# Patient Record
Sex: Male | Born: 1950 | Race: White | Hispanic: No | Marital: Married | Smoking: Never smoker
Health system: Southern US, Community
[De-identification: ages and names within clinical notes are randomized; demographics above are authoritative.]

## PROBLEM LIST (undated history)

## (undated) DIAGNOSIS — E119 Type 2 diabetes mellitus without complications: Secondary | ICD-10-CM

## (undated) DIAGNOSIS — M109 Gout, unspecified: Secondary | ICD-10-CM

## (undated) HISTORY — PX: APPENDECTOMY: SHX54

## (undated) HISTORY — PX: CHOLECYSTECTOMY: SHX55

---

## 2018-09-01 ENCOUNTER — Emergency Department (HOSPITAL_BASED_OUTPATIENT_CLINIC_OR_DEPARTMENT_OTHER): Payer: Medicare HMO

## 2018-09-01 ENCOUNTER — Encounter (HOSPITAL_BASED_OUTPATIENT_CLINIC_OR_DEPARTMENT_OTHER): Payer: Self-pay | Admitting: Adult Health

## 2018-09-01 ENCOUNTER — Other Ambulatory Visit: Payer: Self-pay

## 2018-09-01 ENCOUNTER — Emergency Department (HOSPITAL_BASED_OUTPATIENT_CLINIC_OR_DEPARTMENT_OTHER)
Admission: EM | Admit: 2018-09-01 | Discharge: 2018-09-01 | Disposition: A | Discharge status: Home / Self Care | Payer: Medicare HMO | Attending: Emergency Medicine | Admitting: Emergency Medicine

## 2018-09-01 DIAGNOSIS — E119 Type 2 diabetes mellitus without complications: Secondary | ICD-10-CM | POA: Insufficient documentation

## 2018-09-01 DIAGNOSIS — Z9049 Acquired absence of other specified parts of digestive tract: Secondary | ICD-10-CM | POA: Diagnosis not present

## 2018-09-01 DIAGNOSIS — M25561 Pain in right knee: Secondary | ICD-10-CM | POA: Insufficient documentation

## 2018-09-01 HISTORY — DX: Gout, unspecified: M10.9

## 2018-09-01 HISTORY — DX: Type 2 diabetes mellitus without complications: E11.9

## 2018-09-01 MED ORDER — CLINDAMYCIN HCL 300 MG PO CAPS: 300.0000 mg | ORAL_CAPSULE | Freq: Four times a day (QID) | ORAL | 0 refills | Status: AC

## 2018-09-01 MED ORDER — CLINDAMYCIN HCL 150 MG PO CAPS
300.0000 mg | ORAL_CAPSULE | Freq: Once | ORAL | Status: AC
  Administered 2018-09-01: 300 mg via ORAL
  Filled 2018-09-01: qty 2

## 2018-09-01 MED ORDER — HYDROCODONE-ACETAMINOPHEN 5-325 MG PO TABS: 1.0000 | ORAL_TABLET | Freq: Four times a day (QID) | ORAL | 0 refills | Status: AC | PRN

## 2018-09-01 MED ORDER — HYDROCODONE-ACETAMINOPHEN 5-325 MG PO TABS
1.0000 | ORAL_TABLET | Freq: Once | ORAL | Status: AC
  Administered 2018-09-01: 1 via ORAL
  Filled 2018-09-01: qty 1

## 2018-09-01 NOTE — ED Triage Notes (Signed)
PResents with right knee pain and soreness. He has a hx of gout. This began yesterday morning. Ibuporfen is not helping.

## 2018-09-01 NOTE — ED Provider Notes (Signed)
[facility] EMERGENCY DEPARTMENT Provider Note   CSN: [id number] Arrival date & time: 09/01/18  1436     History   Chief Complaint Chief Complaint  Patient presents with  . Knee Pain    HPI Jermaine Parks is a 68 y.o. male.  Jermaine Parks is a 68 y.o. male with history of diabetes and gout, who presents to the emergency department for evaluation of right knee pain.  Symptoms began 2 days ago when patient woke up.  He denies any injury or trauma to the knee.  Reports he has a knot just below his patella on the right knee but this is been present since childhood and is unchanged.  No pain in the lower leg or calf, no pain in the ankle or hip.  Patient reports history of gout and this pain feels similar but patient reports he has not had a gout flare in about 15 years, used to be put on colchicine for his gout flares per report this did not agree well with his stomach.  He denies any similar pain or symptoms in other joints.  Reports the area has become slightly swollen but he is able to bend and extend it, pain is worse with movement and weightbearing.  He reports aching a few doses of ibuprofen yesterday and today which has not helped with this pain he denies any other aggravating or alleviating factors.  No associated fevers or chills, no nausea or vomiting or general malaise.  No chest pain or shortness of breath.     Past Medical History:  Diagnosis Date  . Diabetes mellitus without complication (HCC)   . Gout     There are no active problems to display for this patient.   Past Surgical History:  Procedure Laterality Date  . APPENDECTOMY    . CHOLECYSTECTOMY          Home Medications    Prior to Admission medications   Not on File    Family History History reviewed. No pertinent family history.  Social History Social History   Tobacco Use  . Smoking status: Never Smoker  . Smokeless tobacco: Never Used  Substance Use Topics  . Alcohol use: Never      Frequency: Never  . Drug use: Never     Allergies   Patient has no known allergies.   Review of Systems Review of Systems  Constitutional: Negative for chills and fever.  Respiratory: Negative for shortness of breath.   Cardiovascular: Negative for chest pain.  Gastrointestinal: Negative for nausea and vomiting.  Musculoskeletal: Positive for arthralgias and joint swelling.  Skin: Negative for color change, rash and wound.  Neurological: Negative for weakness and numbness.  All other systems reviewed and are negative.    Physical Exam Updated Vital Signs BP (!) 115/97   Pulse 75   Temp 97.9 F (36.6 C) (Oral)   Resp 18   SpO2 97%   Physical Exam Vitals signs and nursing note reviewed.  Constitutional:      General: He is not in acute distress.    Appearance: Normal appearance. He is well-developed. He is not ill-appearing or diaphoretic.  HENT:     Head: Normocephalic and atraumatic.  Eyes:     General:        Right eye: No discharge.        Left eye: No discharge.  Pulmonary:     Effort: Pulmonary effort is normal. No respiratory distress.  Musculoskeletal:     Comments:  Right knee with tenderness to palpation over the anterior knee.  There is a bony prominence at the tibial tuberosity with some overlying effusion and erythema, but there is no obvious larger effusion of the knee joint itself.  Patient is able to flex and extend greater than 90 degrees with some discomfort.  There is no obvious deformity and no significant joint laxity.  No tenderness or swelling in the calf, 2+ DP and TP pulses and good capillary refill, normal sensation and strength.  Skin:    General: Skin is warm and dry.  Neurological:     Mental Status: He is alert and oriented to person, place, and time. Mental status is at baseline.     Coordination: Coordination normal.  Psychiatric:        Mood and Affect: Mood normal.        Behavior: Behavior normal.      ED Treatments /  Results  Labs (all labs ordered are listed, but only abnormal results are displayed) Labs Reviewed - No data to display  EKG None  Radiology Dg Knee Complete 4 Views Right  Result Date: 09/01/2018 CLINICAL DATA:  Knee pain x 2 days; h/o gout; no injury; pain anterior knee just below patella; bump on same spot has had since childhood EXAM: RIGHT KNEE - COMPLETE 4+ VIEW COMPARISON:  None. FINDINGS: No fracture. There is fragmentation of the tibial tuberosity consistent with the sequela of Osgood-Schlatter's disease. There is overlying soft tissue swelling. Mild narrowing of the medial joint space compartment with small marginal osteophytes. No other arthropathic/degenerative change. No joint effusion. IMPRESSION: 1. No fracture or acute finding. 2. Fragmentation of the tibial tuberosity consistent with the sequela of Osgood-Schlatter disease with overlying soft tissue swelling. 3. Mild osteoarthritis. Electronically Signed   By: Amie Portlandavid  Ormond M.D.   On: 09/01/2018 15:57    Procedures Procedures (including critical care time)  Medications Ordered in ED Medications  HYDROcodone-acetaminophen (NORCO/VICODIN) 5-325 MG per tablet 1 tablet (1 tablet Oral Given 09/01/18 1730)  clindamycin (CLEOCIN) capsule 300 mg (300 mg Oral Given 09/01/18 1809)     Initial Impression / Assessment and Plan / ED Course  I have reviewed the triage vital signs and the nursing notes.  Pertinent labs & imaging results that were available during my care of the patient were reviewed by me and considered in my medical decision making (see chart for details).  Patient presents to the emergency department for evaluation of right knee pain over the past 2 days, no associated trauma or injury.  History of gout but has not had flare in over 15 years.  Took ibuprofen without relief.  On exam patient has bony prominence at the tibial tuberosity and this is where her pain is most well localized, the area is tender to palpation with  small a mount of fluid overlying the prominence, do not feel that there is a large enough effusion to drain.  There is no evidence of self.  Patient is able to flex and extend greater than 90 degrees.  X-ray shows fragmentation of the tibial tuberosity consistent with sequelae of Osgood slaughters disease with some overlying soft tissue swelling and there is some mild osteoarthritis of the knee joint but no effusion within the joint no other fractures or acute findings.  Case discussed with Dr. Fredderick PhenixBelfi who saw and evaluated patient as well and is concerned that patient may have superficial infection over the tibial tuberosity given small effusion but does not feel this is large  enough to drain.  Recommends demarcation and will start patient on clindamycin and prescribed small amount of pain medication.  Patient is here visiting from [place] and does not have a primary care doctor to follow-up within the area.  I have discussed strict return precautions with the patient if redness exceeds outside of the demarcated area, swelling worsens or patient has significantly worsened pain, fevers or chills or any other new or concerning symptoms occur he should return for reevaluation.  First dose of clindamycin and pain medication given here in the emergency department.  Plan discussed with patient and wife and they expressed understanding.  Patient will be discharged home in good condition.  Patient discussed with Dr. Fredderick Phenix, who saw patient as well and agrees with plan.   Final Clinical Impressions(s) / ED Diagnoses   Final diagnoses:  Acute pain of right knee    ED Discharge Orders         Ordered    clindamycin (CLEOCIN) 300 MG capsule  4 times daily     09/01/18 1810    HYDROcodone-acetaminophen (NORCO) 5-325 MG tablet  Every 6 hours PRN     09/01/18 1810           Dartha Lodge, [place] 09/02/18 0147    Rolan Bucco, MD 09/02/18 1501

## 2018-09-01 NOTE — ED Notes (Signed)
Right side knee pain x 2 days. Pt states he woke up with pain. Denies injury. pt has taken ibuprofen around 0900 today with no relief. Pain increases with ambulation, difficult to bear wt on right leg

## 2018-09-01 NOTE — Discharge Instructions (Signed)
Take clindamycin 4 times daily and pain medication as needed, ice and elevate the knee, monitor the knee closely and watch for redness extending outside of the marked area, increasing swelling or pain, if this occurs despite antibiotics please return to the emergency department for reevaluation.  Please also return if you develop fevers, nausea, vomiting, or unable to bend or straighten the leg or any other new or concerning symptoms occur.

## 2020-09-03 IMAGING — CR DG KNEE COMPLETE 4+V*R*
4 series · 4 of 4 positions shown · non-contrast
Comparison: None.

CLINICAL DATA: Knee pain x 2 days; h/o gout; no injury; pain
anterior knee just below patella; bump on same spot has had since
childhood

EXAM:
RIGHT KNEE - COMPLETE 4+ VIEW

[t knee ap right]
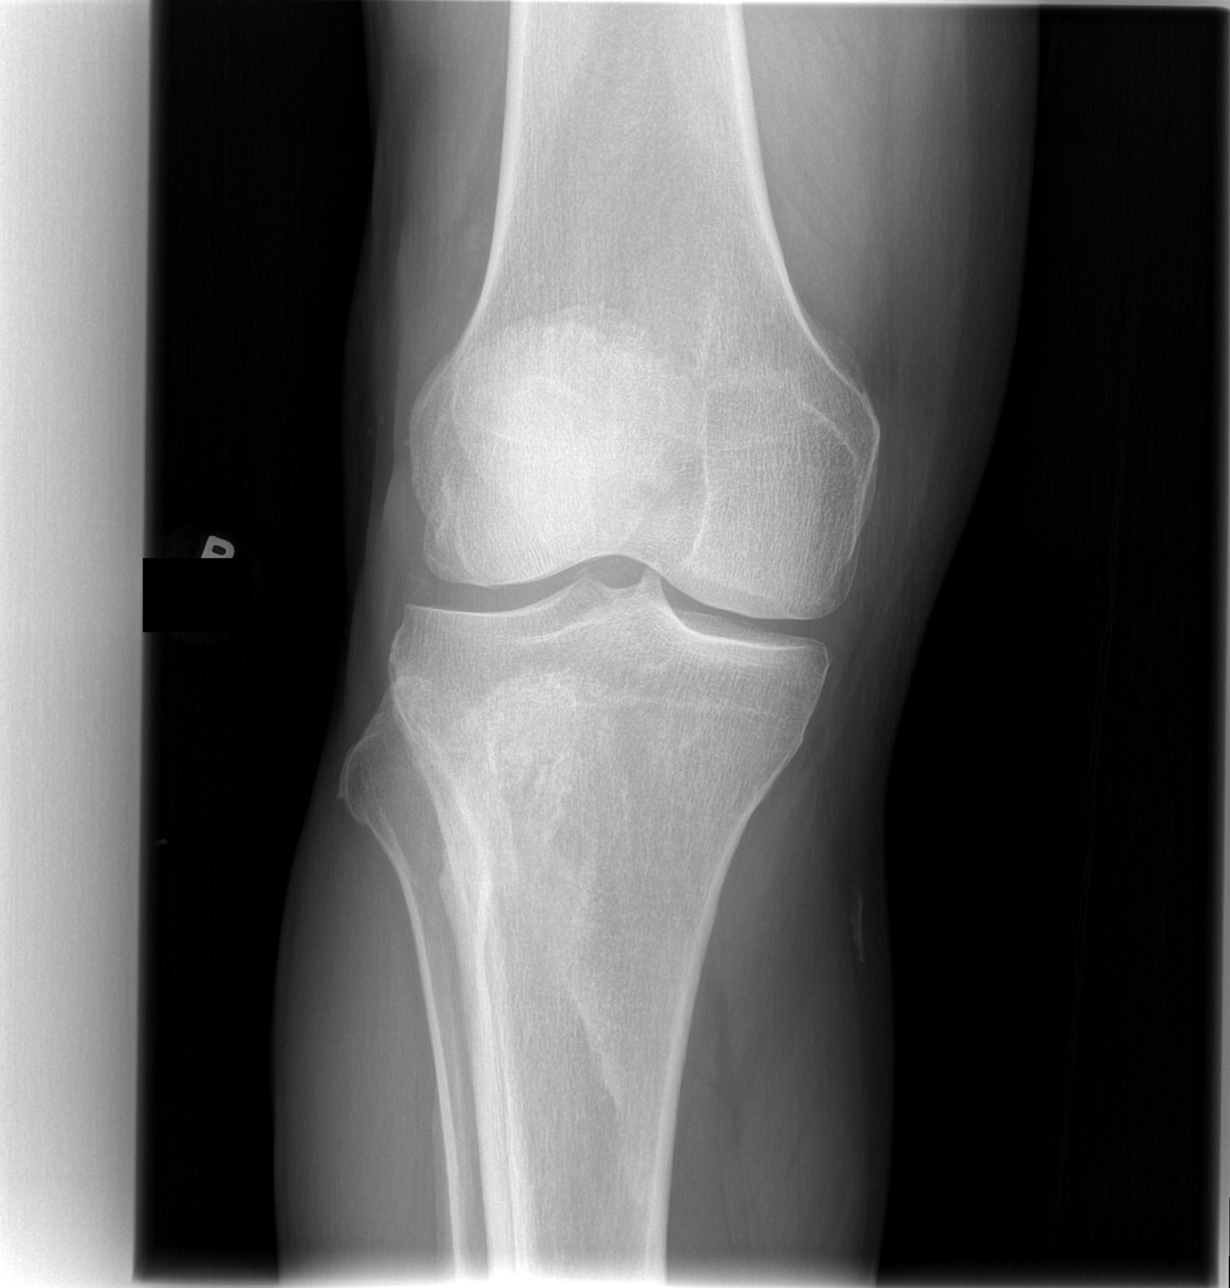

[t knee oblique right (1 of 2)]
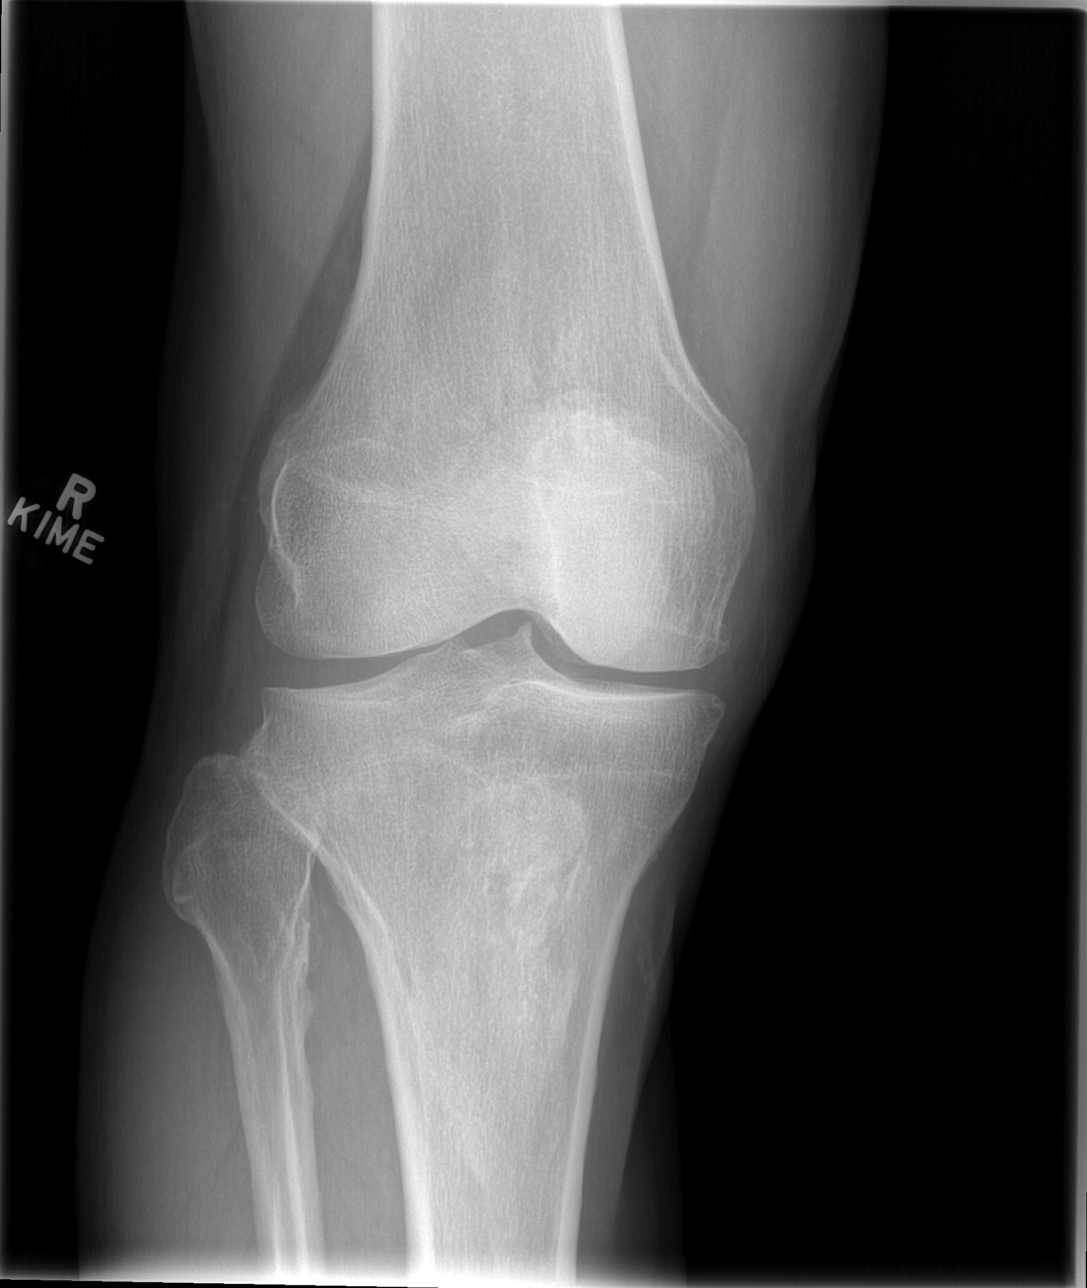

[t knee oblique right (2 of 2)]
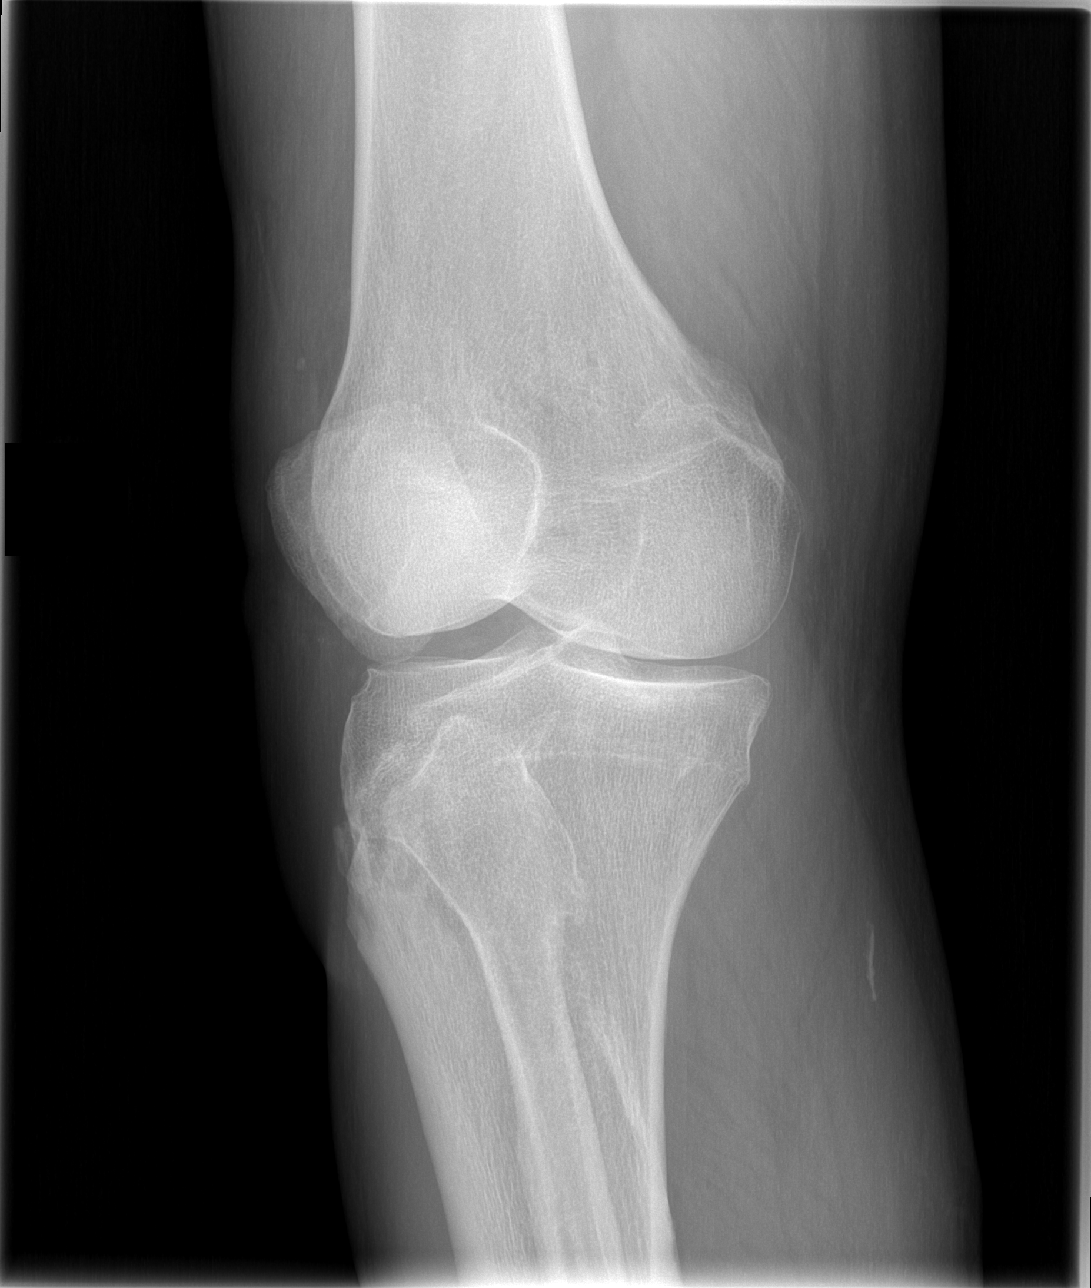

[t knee lat right]
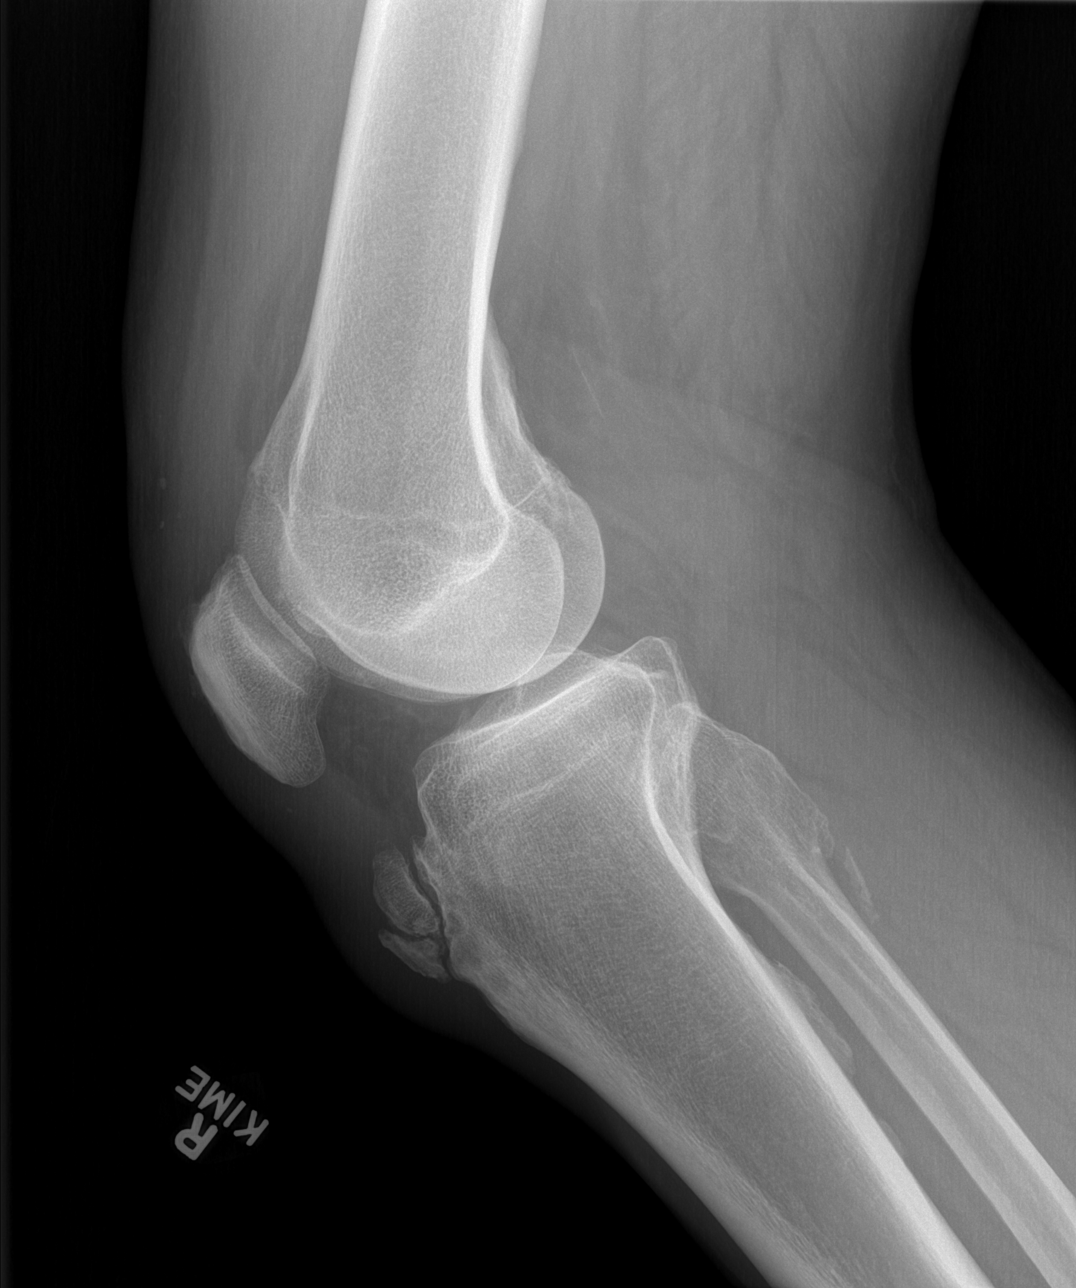

[4 of 4 positions shown; findings below may reference images not displayed]

FINDINGS: No fracture. There is fragmentation of the tibial tuberosity
consistent with the sequela of Osgood-Schlatter's disease. There is
overlying soft tissue swelling.

Mild narrowing of the medial joint space compartment with small
marginal osteophytes. No other arthropathic/degenerative change. No
joint effusion.
IMPRESSION: 1. No fracture or acute finding.
2. Fragmentation of the tibial tuberosity consistent with the
sequela of Osgood-Schlatter disease with overlying soft tissue
swelling.
3. Mild osteoarthritis.
